# Patient Record
Sex: Male | Born: 1940 | Race: White | Hispanic: No | Smoking: Former smoker
Health system: Southern US, Community
[De-identification: ages and names within clinical notes are randomized; demographics above are authoritative.]

## PROBLEM LIST (undated history)

## (undated) DIAGNOSIS — N138 Other obstructive and reflux uropathy: Secondary | ICD-10-CM

## (undated) DIAGNOSIS — N401 Enlarged prostate with lower urinary tract symptoms: Principal | ICD-10-CM

## (undated) DIAGNOSIS — R350 Frequency of micturition: Secondary | ICD-10-CM

## (undated) DIAGNOSIS — I4891 Unspecified atrial fibrillation: Secondary | ICD-10-CM

## (undated) DIAGNOSIS — I422 Other hypertrophic cardiomyopathy: Secondary | ICD-10-CM

## (undated) DIAGNOSIS — L309 Dermatitis, unspecified: Secondary | ICD-10-CM

## (undated) DIAGNOSIS — L209 Atopic dermatitis, unspecified: Secondary | ICD-10-CM

## (undated) HISTORY — DX: Frequency of micturition: R35.0

## (undated) HISTORY — DX: Unspecified atrial fibrillation: I48.91

## (undated) HISTORY — DX: Other hypertrophic cardiomyopathy: I42.2

## (undated) HISTORY — DX: Atopic dermatitis, unspecified: L20.9

## (undated) HISTORY — DX: Dermatitis, unspecified: L30.9

## (undated) HISTORY — DX: Other obstructive and reflux uropathy: N40.1

## (undated) HISTORY — PX: TOTAL KNEE ARTHROPLASTY: SHX125

## (undated) HISTORY — DX: Other obstructive and reflux uropathy: N13.8

---

## 2008-03-17 ENCOUNTER — Observation Stay: Payer: Self-pay | Admitting: Internal Medicine

## 2011-06-28 ENCOUNTER — Ambulatory Visit: Payer: Self-pay | Admitting: Cardiology

## 2013-08-19 ENCOUNTER — Other Ambulatory Visit: Payer: Self-pay | Admitting: Podiatry

## 2013-08-19 LAB — SYNOVIAL FLUID, CRYSTAL: Crystals, Joint Fluid: NONE SEEN

## 2015-07-21 ENCOUNTER — Encounter: Payer: Self-pay | Admitting: *Deleted

## 2015-07-21 ENCOUNTER — Ambulatory Visit (INDEPENDENT_AMBULATORY_CARE_PROVIDER_SITE_OTHER): Payer: Medicare Other | Admitting: Urology

## 2015-07-21 VITALS — BP 142/83 | HR 57 | Ht 68.0 in | Wt 200.0 lb

## 2015-07-21 DIAGNOSIS — Z87440 Personal history of urinary (tract) infections: Secondary | ICD-10-CM | POA: Diagnosis not present

## 2015-07-21 DIAGNOSIS — N401 Enlarged prostate with lower urinary tract symptoms: Secondary | ICD-10-CM | POA: Diagnosis not present

## 2015-07-21 DIAGNOSIS — N138 Other obstructive and reflux uropathy: Secondary | ICD-10-CM | POA: Insufficient documentation

## 2015-07-21 LAB — URINALYSIS, COMPLETE
Bilirubin, UA: NEGATIVE
GLUCOSE, UA: NEGATIVE
Nitrite, UA: POSITIVE — AB
SPEC GRAV UA: 1.02 (ref 1.005–1.030)
Urobilinogen, Ur: 0.2 mg/dL (ref 0.2–1.0)
pH, UA: 6 (ref 5.0–7.5)

## 2015-07-21 LAB — MICROSCOPIC EXAMINATION

## 2015-07-21 NOTE — Progress Notes (Signed)
07/21/2015 9:42 AM   Andrew Anthony August 29, 1940 [id number]  Referring provider: No referring provider defined for this encounter.  Chief Complaint  Patient presents with  . Benign Prostatic Hypertrophy    1 year follow up    HPI: Patient is a 76 year old Caucasian male who presents today for an UTI and BPH with LUTS one year follow up.    UTI Patient was treated for an asymptomatic UTI by his PCP in January.   He was given Septra DS for seven days.    BPH WITH LUTS His IPSS score today is 12, which is moderate lower urinary tract symptomatology. He is mostly satisfied with his quality life due to his urinary symptoms.  His major complaint today urinary urgency.  He has had these symptoms for several  years.  He denies any dysuria, hematuria or suprapubic pain.   He also denies any recent fevers, chills, nausea or vomiting.  He has a family history of PCa, with his brother being diagnosed with prostate cancer.        IPSS      07/21/15 0900       International Prostate Symptom Score   How often have you had the sensation of not emptying your bladder? Less than 1 in 5     How often have you had to urinate less than every two hours? About half the time     How often have you found you stopped and started again several times when you urinated? Less than 1 in 5 times     How often have you found it difficult to postpone urination? About half the time     How often have you had a weak urinary stream? Less than half the time     How often have you had to strain to start urination? Not at All     How many times did you typically get up at night to urinate? 2 Times     Total IPSS Score 12     Quality of Life due to urinary symptoms   If you were to spend the rest of your life with your urinary condition just the way it is now how would you feel about that? Mostly Satisfied        Score:  1-7 Mild 8-19 Moderate 20-35 Severe       PMH: Past Medical History  Diagnosis Date   . Urinary frequency   . Hypertrophic cardiomyopathy (HCC)   . Atrial fibrillation (HCC)   . BPH with obstruction/lower urinary tract symptoms   . Atopic dermatitis   . Eczema     Surgical History: Past Surgical History  Procedure Laterality Date  . Total knee arthroplasty Right     Home Medications:    Medication List       This list is accurate as of: 07/21/15  9:42 AM.  Always use your most recent med list.               amiodarone 200 MG tablet  Commonly known as:  PACERONE  Take 100 mg by mouth.     cetirizine 10 MG tablet  Commonly known as:  ZYRTEC  Take 10 mg by mouth.     clobetasol cream 0.05 %  Commonly known as:  TEMOVATE     sulfamethoxazole-trimethoprim 800-160 MG tablet  Commonly known as:  BACTRIM DS,SEPTRA DS  Take 1 tablet by mouth 2 (two) times daily. Reported on 07/21/2015  terbinafine 1 % cream  Commonly known as:  LAMISIL  Apply topically. Reported on 07/21/2015     triamcinolone cream 0.1 %  Commonly known as:  KENALOG  Reported on 07/21/2015     XARELTO 20 MG Tabs tablet  Generic drug:  rivaroxaban  Take 20 mg by mouth.        Allergies:  Allergies  Allergen Reactions  . Azithromycin   . Clarithromycin Other (See Comments)    Dizzy, hot flashes  . Clindamycin/Lincomycin   . Penicillins   . Trimox [Amoxicillin]     Family History: Family History  Problem Relation Age of Onset  . Parkinson's disease Brother   . Prostate cancer Brother   . Kidney disease Neg Hx   . Heart failure Father     Social History:  reports that he has quit smoking. His smoking use included Cigars. He does not have any smokeless tobacco history on file. He reports that he does not drink alcohol or use illicit drugs.  ROS: UROLOGY Frequent Urination?: Yes Hard to postpone urination?: No Burning/pain with urination?: No Get up at night to urinate?: No Leakage of urine?: No Urine stream starts and stops?: No Trouble starting stream?: No Do  you have to strain to urinate?: No Blood in urine?: No Urinary tract infection?: Yes Sexually transmitted disease?: No Injury to kidneys or bladder?: No Painful intercourse?: No Weak stream?: No Erection problems?: No Penile pain?: No  Gastrointestinal Nausea?: No Vomiting?: No Indigestion/heartburn?: No Diarrhea?: No Constipation?: No  Constitutional Fever: No Night sweats?: No Weight loss?: No Fatigue?: No  Skin Skin rash/lesions?: No Itching?: Yes  Eyes Blurred vision?: No Double vision?: No  Ears/Nose/Throat Sore throat?: No Sinus problems?: No  Hematologic/Lymphatic Swollen glands?: No Easy bruising?: Yes  Cardiovascular Leg swelling?: No Chest pain?: No  Respiratory Cough?: No Shortness of breath?: No  Endocrine Excessive thirst?: No  Musculoskeletal Back pain?: No Joint pain?: Yes  Neurological Headaches?: No Dizziness?: No  Psychologic Depression?: No Anxiety?: No  Physical Exam: BP 142/83 mmHg  Pulse 57  Ht  (1.727 m)  Wt 200 lb (90.719 kg)  BMI 30.42 kg/m2  Constitutional: Well nourished. Alert and oriented, No acute distress. HEENT: [place] AT, moist mucus membranes. Trachea midline, no masses. Cardiovascular: No clubbing, cyanosis, or edema. Respiratory: Normal respiratory effort, no increased work of breathing. GI: Abdomen is soft, non tender, non distended, no abdominal masses. Liver and spleen not palpable.  No hernias appreciated.  Stool sample for occult testing is not indicated.   GU: No CVA tenderness.  No bladder fullness or masses.  Patient with circumcised phallus.   Urethral meatus is patent.  No penile discharge. No penile lesions or rashes. Scrotum without lesions, cysts, rashes and/or edema.  Testicles are located scrotally bilaterally. No masses are appreciated in the testicles. Left and right epididymis are normal. Rectal: Patient with  normal sphincter tone. Anus and perineum without scarring or rashes. No rectal  masses are appreciated. Prostate is approximately 55 grams, irregular, no nodules are appreciated. Seminal vesicles are normal. Skin: No rashes, bruises or suspicious lesions. Lymph: No cervical or inguinal adenopathy. Neurologic: Grossly intact, no focal deficits, moving all 4 extremities. Psychiatric: Normal mood and affect.  Laboratory Data: PSA History  1.18 ng/mL on 05/14/2013  1.09 ng/mL on 05/26/2015  Urinalysis Results for orders placed or performed in visit on 07/21/15  Microscopic Examination  Result Value Ref Range   WBC, UA >30 (H) 0 -  5 /hpf  RBC, UA 0-2 0 -  2 /hpf   Epithelial Cells (non renal) 0-10 0 - 10 /hpf   Mucus, UA Present (A) Not Estab.   Bacteria, UA Moderate (A) None seen/Few  Urinalysis, Complete  Result Value Ref Range   Specific Gravity, UA 1.020 1.005 - 1.030   pH, UA 6.0 5.0 - 7.5   Color, UA Yellow Yellow   Appearance Ur Cloudy (A) Clear   Leukocytes, UA 3+ (A) Negative   Protein, UA Trace (A) Negative/Trace   Glucose, UA Negative Negative   Ketones, UA Trace (A) Negative   RBC, UA Trace (A) Negative   Bilirubin, UA Negative Negative   Urobilinogen, Ur 0.2 0.2 - 1.0 mg/dL   Nitrite, UA Positive (A) Negative   Microscopic Examination See below:      Assessment & Plan:    1. UTI:   Patient was treated for an asymptomatic UTI in January.  Patient seems to perseverate on his urine.  He states he collects it in jars to look at the color and consistency of his urine.  I explained to the patient that due to the increased resistance of organisms to antibiotics and risk factors associated with taking antibiotics, such as C. Difficile, we are conservative concerning treatment of asymptomatic UTI's.  We would like to reserve antibiotic use for urinary tract infections that caused fevers, dysuria, gross hematuria, abdominal pain or mental status changes.  I have advised the patient not to have his UA performed, she was having the above listed  symptoms.  2. BPH (benign prostatic hyperplasia) with LUTS:    IPSS score is 12/2.  Patient did recently have his PSA drawn, but he would like it repeated.  He stated he doesn't remember the PSA being over 1 in the past.  I did not have access to his previous PSA at the time of his visit, but the PSA was 1.18 in 2015.  We will continue to monitor.  He will have his IPSS score, exam and PSA in 12 months.  - PSA - Urinalysis, Complete   Return in about 1 year (around 07/20/2016) for IPSS score and exam.  These notes generated with voice recognition software. I apologize for typographical errors.  Michiel CowboySHANNON Karee Christopherson, PA-C  [facility] [address], [address] [place], [place] [address] [phone]

## 2015-07-22 ENCOUNTER — Telehealth: Payer: Self-pay

## 2015-07-22 LAB — PSA: Prostate Specific Ag, Serum: 1.3 ng/mL (ref 0.0–4.0)

## 2015-07-22 NOTE — Telephone Encounter (Signed)
LMOM

## 2015-07-22 NOTE — Telephone Encounter (Signed)
-----   Message from Harle BattiestShannon A McGowan, PA-C sent at 07/22/2015  8:24 AM EDT ----- PSA is stable.  He would like to be told the actual value.  We will see him next year.

## 2015-07-23 NOTE — Telephone Encounter (Signed)
Spoke with pt in reference PSA results. Pt voiced understanding.  

## 2016-07-18 ENCOUNTER — Other Ambulatory Visit: Payer: Self-pay

## 2016-07-18 DIAGNOSIS — N401 Enlarged prostate with lower urinary tract symptoms: Secondary | ICD-10-CM

## 2016-07-19 ENCOUNTER — Other Ambulatory Visit: Payer: Medicare Other

## 2016-07-19 DIAGNOSIS — N401 Enlarged prostate with lower urinary tract symptoms: Secondary | ICD-10-CM

## 2016-07-20 LAB — PSA: PROSTATE SPECIFIC AG, SERUM: 2.1 ng/mL (ref 0.0–4.0)

## 2016-07-25 NOTE — Progress Notes (Signed)
07/26/2016 10:05 AM   Maxine Glenn [birth year] [id number]  Referring provider: Rafael Bihari, MD [phone] [facility] [address] [facility] [place], [place] [address]  Chief Complaint  Patient presents with  . Benign Prostatic Hypertrophy    1 year follow up     HPI: Patient is a 76 year old Caucasian male with BPH with LUTS who presents for a one year follow up.    BPH WITH LUTS His IPSS score today is 15, which is moderate lower urinary tract symptomatology. He is mixed with his quality life due to his urinary symptoms.  His PVR was 96 mL.  His previous I PSS score was 12/3.  His major complaint today is a weak urinary stream.  He has had these symptoms for several  years.  He denies any dysuria, hematuria or suprapubic pain.   He also denies any recent fevers, chills, nausea or vomiting.  He has a family history of PCa, with his brother being diagnosed with prostate cancer.        IPSS    Row Name 07/26/16 0900         International Prostate Symptom Score   How often have you had the sensation of not emptying your bladder? Less than half the time     How often have you had to urinate less than every two hours? Less than half the time     How often have you found you stopped and started again several times when you urinated? Less than half the time     How often have you found it difficult to postpone urination? Less than half the time     How often have you had a weak urinary stream? About half the time     How often have you had to strain to start urination? Less than half the time     How many times did you typically get up at night to urinate? 2 Times     Total IPSS Score 15       Quality of Life due to urinary symptoms   If you were to spend the rest of your life with your urinary condition just the way it is now how would you feel about that? Mixed        Score:  1-7 Mild 8-19 Moderate 20-35 Severe   PMH: Past Medical History:  Diagnosis Date  . Atopic  dermatitis   . Atrial fibrillation (HCC)   . BPH with obstruction/lower urinary tract symptoms   . Eczema   . Hypertrophic cardiomyopathy (HCC)   . Urinary frequency     Surgical History: Past Surgical History:  Procedure Laterality Date  . TOTAL KNEE ARTHROPLASTY Right     Home Medications:  Allergies as of 07/26/2016      Reactions   Azithromycin    Clarithromycin Other (See Comments)   Dizzy, hot flashes   Clindamycin/lincomycin    Lincomycin Hcl    Penicillins    Trimox [amoxicillin]       Medication List       Accurate as of 07/26/16 10:05 AM. Always use your most recent med list.          amiodarone 200 MG tablet Commonly known as:  PACERONE Take 100 mg by mouth.   cetirizine 10 MG tablet Commonly known as:  ZYRTEC Take 10 mg by mouth.   clobetasol cream 0.05 % Commonly known as:  TEMOVATE Apply 1 application topically 2 (two)  times daily.   mometasone 0.1 % lotion Commonly known as:  ELOCON   sulfamethoxazole-trimethoprim 800-160 MG tablet Commonly known as:  BACTRIM DS,SEPTRA DS Take 1 tablet by mouth 2 (two) times daily. Reported on 07/21/2015   terbinafine 1 % cream Commonly known as:  LAMISIL Apply topically. Reported on 07/21/2015   triamcinolone cream 0.1 % Commonly known as:  KENALOG Reported on 07/21/2015   XARELTO 20 MG Tabs tablet Generic drug:  rivaroxaban Take 20 mg by mouth.       Allergies:  Allergies  Allergen Reactions  . Azithromycin   . Clarithromycin Other (See Comments)    Dizzy, hot flashes  . Clindamycin/Lincomycin   . Lincomycin Hcl   . Penicillins   . Trimox [Amoxicillin]     Family History: Family History  Problem Relation Age of Onset  . Parkinson's disease Brother   . Prostate cancer Brother   . Heart failure Father   . Kidney disease Neg Hx   . Kidney cancer Neg Hx   . Bladder Cancer Neg Hx     Social History:  reports that he has quit smoking. His smoking use included Cigars. He has quit using  smokeless tobacco. His smokeless tobacco use included Chew. He reports that he does not drink alcohol or use drugs.  ROS: UROLOGY Frequent Urination?: No Hard to postpone urination?: No Burning/pain with urination?: No Get up at night to urinate?: No Leakage of urine?: No Urine stream starts and stops?: No Trouble starting stream?: No Do you have to strain to urinate?: No Blood in urine?: No Urinary tract infection?: No Sexually transmitted disease?: No Injury to kidneys or bladder?: No Painful intercourse?: No Weak stream?: No Erection problems?: No Penile pain?: No  Gastrointestinal Nausea?: No Vomiting?: No Indigestion/heartburn?: No Diarrhea?: No Constipation?: No  Constitutional Fever: No Night sweats?: No Weight loss?: No Fatigue?: No  Skin Skin rash/lesions?: Yes Itching?: Yes  Eyes Blurred vision?: No Double vision?: No  Ears/Nose/Throat Sore throat?: No Sinus problems?: No  Hematologic/Lymphatic Swollen glands?: No Easy bruising?: No  Cardiovascular Leg swelling?: No Chest pain?: No  Respiratory Cough?: No Shortness of breath?: No  Endocrine Excessive thirst?: No  Musculoskeletal Back pain?: No Joint pain?: No  Neurological Headaches?: No Dizziness?: No  Psychologic Depression?: No Anxiety?: No  Physical Exam: BP 120/85   Pulse 83   Ht 5\' 9"  (1.753 m)   Wt 189 lb 6.4 oz (85.9 kg)   BMI 27.97 kg/m   Constitutional: Well nourished. Alert and oriented, No acute distress. HEENT: [place] AT, moist mucus membranes. Trachea midline, no masses. Cardiovascular: No clubbing, cyanosis, or edema. Respiratory: Normal respiratory effort, no increased work of breathing. GI: Abdomen is soft, non tender, non distended, no abdominal masses. Liver and spleen not palpable.  No hernias appreciated.  Stool sample for occult testing is not indicated.   GU: No CVA tenderness.  No bladder fullness or masses.  Patient with circumcised phallus.    Urethral meatus is patent.  No penile discharge. No penile lesions or rashes. Scrotum without lesions, cysts, rashes and/or edema.  Testicles are located scrotally bilaterally. No masses are appreciated in the testicles. Left and right epididymis are normal. Rectal: Patient with  normal sphincter tone. Anus and perineum without scarring or rashes. No rectal masses are appreciated. Prostate is approximately 55 grams, irregular, no nodules are appreciated. Seminal vesicles are normal. Skin: No rashes, bruises or suspicious lesions. Lymph: No cervical or inguinal adenopathy. Neurologic: Grossly intact, no focal deficits, moving  all 4 extremities. Psychiatric: Normal mood and affect.  Laboratory Data: PSA History  1.18 ng/mL on 05/14/2013  1.09 ng/mL on 05/26/2015  1.50 ng/mL on 01/11/2016 - free screening  3.67 ng/mL on 05/26/2016 - PCP's office  2.10 ng/mL on 07/19/2016  Pertinent Imaging Results for orders placed or performed in visit on 07/26/16  BLADDER SCAN AMB NON-IMAGING  Result Value Ref Range   Scan Result 96      Assessment & Plan:    1. BPH with LUTS  - IPSS score is 15/3, it is worsening  - Continue conservative management, avoiding bladder irritants and timed voiding's  - most bothersome symptoms is/are a weak stream  - discussed starting a medication at this time, finasteride, but patient deferred at this time  - RTC in 6 months for IPSS, PSA, PVR and exam   2. UTI  - Patient was treated for an asymptomatic UTI in January.  Patient seems to perseverate on his urine.  He states he collects it in jars to look at the color and consistency of his urine.  I explained to the patient that due to the increased resistance of organisms to antibiotics and risk factors associated with taking antibiotics, such as C. Difficile, we are conservative concerning treatment of asymptomatic UTI's.  We would like to reserve antibiotic use for urinary tract infections that caused fevers,  dysuria, gross hematuria, abdominal pain or mental status changes.  I have advised the patient not to have his UA performed, she was having the above listed symptoms.  3. PSA Screening  - I discussed the AUA Guideline's (2013) for men aged 70+ years or any man with less than a 10 to 15 year life expectancy that screening is not recommended.  If the individual is in excellent health and after discussion it is decided to do a screening PSA, the threshold for biopsy should be raised to 10 ng/mL and if the PSA returns below 3 ng/mL, discontinue screening - patient is very concerned about his PSA levels as he attends free screenings and has his PCP check his PSA.  I explained to the patient that at his age if prostate cancer is diagnosed the treatment would be aimed at palliative vs curative - he would like to have his PSA every six months  Return in about 6 months (around 01/26/2017) for IPSS, PSA and exam.  These notes generated with voice recognition software. I apologize for typographical errors.  Michiel Cowboy, PA-C  [facility] [address], [address] [place], [place] [address] [phone]

## 2016-07-26 ENCOUNTER — Ambulatory Visit (INDEPENDENT_AMBULATORY_CARE_PROVIDER_SITE_OTHER): Payer: Medicare Other | Admitting: Urology

## 2016-07-26 ENCOUNTER — Encounter: Payer: Self-pay | Admitting: Urology

## 2016-07-26 VITALS — BP 120/85 | HR 83 | Ht 69.0 in | Wt 189.4 lb

## 2016-07-26 DIAGNOSIS — N401 Enlarged prostate with lower urinary tract symptoms: Secondary | ICD-10-CM

## 2016-07-26 DIAGNOSIS — N3000 Acute cystitis without hematuria: Secondary | ICD-10-CM

## 2016-07-26 DIAGNOSIS — N138 Other obstructive and reflux uropathy: Secondary | ICD-10-CM | POA: Diagnosis not present

## 2016-07-26 DIAGNOSIS — Z125 Encounter for screening for malignant neoplasm of prostate: Secondary | ICD-10-CM

## 2016-07-26 LAB — BLADDER SCAN AMB NON-IMAGING: Scan Result: 96

## 2016-09-15 ENCOUNTER — Emergency Department
Admission: EM | Admit: 2016-09-15 | Discharge: 2016-09-15 | Disposition: A | Discharge status: Home / Self Care | Payer: Medicare Other | Attending: Emergency Medicine | Admitting: Emergency Medicine

## 2016-09-15 ENCOUNTER — Encounter: Payer: Self-pay | Admitting: Emergency Medicine

## 2016-09-15 DIAGNOSIS — T50901A Poisoning by unspecified drugs, medicaments and biological substances, accidental (unintentional), initial encounter: Secondary | ICD-10-CM

## 2016-09-15 DIAGNOSIS — T45511A Poisoning by anticoagulants, accidental (unintentional), initial encounter: Secondary | ICD-10-CM | POA: Insufficient documentation

## 2016-09-15 DIAGNOSIS — Z87891 Personal history of nicotine dependence: Secondary | ICD-10-CM | POA: Insufficient documentation

## 2016-09-15 NOTE — Discharge Instructions (Signed)
Please take your Amiodarone today as normal. Take your xarelto tomorrow afternoon as we discussed.

## 2016-09-15 NOTE — ED Notes (Signed)
Says he mistakenly took his xarelto instead of the amiodarone this am.  He normally takes his xarelto in the evening.  No sx.  Has not taken his amiodarone yet.  In nad.

## 2016-09-15 NOTE — ED Provider Notes (Signed)
[facility] Emergency Department Provider Note   ____________________________________________    I have reviewed the triage vital signs and the nursing notes.   HISTORY  Chief Complaint Medication question     HPI Andrew Anthony is a 76 y.o. male who presents with an accidental drug overdose. Patient reports he takes Xarelto for atrial fibrillation. Today he accidentally took a Xarelto instead of an amiodarone. Typically he takes Xarelto at night and amiodarone in the morning, he did take a Xarelto last night. He tried to make himself throw up but was unsuccessful. He feels well now and has no complaints. No bleeding, normal stools.   Past Medical History:  Diagnosis Date  . Atopic dermatitis   . Atrial fibrillation (HCC)   . BPH with obstruction/lower urinary tract symptoms   . Eczema   . Hypertrophic cardiomyopathy (HCC)   . Urinary frequency     Patient Active Problem List   Diagnosis Date Noted  . BPH with obstruction/lower urinary tract symptoms 07/21/2015    Past Surgical History:  Procedure Laterality Date  . TOTAL KNEE ARTHROPLASTY Right     Prior to Admission medications   Medication Sig Start Date End Date Taking? Authorizing Provider  amiodarone (PACERONE) 200 MG tablet Take 100 mg by mouth. 01/30/14  Yes [provider]  cetirizine (ZYRTEC) 10 MG tablet Take 10 mg by mouth.   Yes [provider]  clobetasol cream (TEMOVATE) 0.05 % Apply 1 application topically 2 (two) times daily.   Yes [provider]  mometasone (ELOCON) 0.1 % lotion  01/21/15   [provider]  rivaroxaban (XARELTO) 20 MG TABS tablet Take 20 mg by mouth. 07/29/14   [provider]  sulfamethoxazole-trimethoprim (BACTRIM DS,SEPTRA DS) 800-160 MG tablet Take 1 tablet by mouth 2 (two) times daily. Reported on 07/21/2015 06/01/15   [provider]  terbinafine (LAMISIL) 1 % cream Apply topically. Reported on  07/21/2015    [provider]  triamcinolone cream (KENALOG) 0.1 % Reported on 07/21/2015 02/12/14   [provider]     Allergies Azithromycin; Clarithromycin; Clindamycin/lincomycin; Lincomycin hcl; Penicillins; and Trimox [amoxicillin]  Family History  Problem Relation Age of Onset  . Parkinson's disease Brother   . Prostate cancer Brother   . Heart failure Father   . Kidney disease Neg Hx   . Kidney cancer Neg Hx   . Bladder Cancer Neg Hx     Social History Social History  Substance Use Topics  . Smoking status: Former Smoker    Types: Cigars  . Smokeless tobacco: Former Neurosurgeon    Types: Chew     Comment: quit 30 years  . Alcohol use No    Review of Systems  Constitutional: No Dizziness Eyes: No visual changes.   Cardiovascular: Denies chest pain. Respiratory: Denies shortness of breath. Gastrointestinal: No abdominal pain.  No nausea, no vomiting.  No dark stools  Genitourinary: Negative for hematuria  Skin: Negative for rash. Neurological: Negative for headaches or weakness   ____________________________________________   PHYSICAL EXAM:  VITAL SIGNS: ED Triage Vitals [09/15/16 0743]  Enc Vitals Group     BP (!) 125/93     Pulse Rate (!) 57     Resp 20     Temp 98.6 F (37 C)     Temp Source Oral     SpO2 100 %     Weight 185 lb (83.9 kg)     Height 5\' 9"  (1.753 m)  Head Circumference      Peak Flow      Pain Score 0     Pain Loc      Pain Edu?      Excl. in GC?     Constitutional: Alert and oriented. No acute distress. Pleasant and interactive Eyes: Conjunctivae are normal.   Nose: No Epistaxis Mouth/Throat: Mucous membranes are moist.  No bleeding from gums  Cardiovascular: Mild bradycardia, regular rhythm.  Good peripheral circulation. Respiratory: Normal respiratory effort.  No retractions. Gastrointestinal: Soft and nontender. No distention.  No CVA tenderness.  Musculoskeletal Warm and well perfused Neurologic:   Normal speech and language. No gross focal neurologic deficits are appreciated.  Skin:  Skin is warm, dry and intact. No petechiae Psychiatric: Mood and affect are normal. Speech and behavior are normal.  ____________________________________________   LABS (all labs ordered are listed, but only abnormal results are displayed)  Labs Reviewed - No data to display ____________________________________________  EKG  None ____________________________________________  RADIOLOGY  None ____________________________________________   PROCEDURES  Procedure(s) performed: No    Critical Care performed: No ____________________________________________   INITIAL IMPRESSION / ASSESSMENT AND PLAN / ED COURSE  Pertinent labs & imaging results that were available during my care of the patient were reviewed by me and considered in my medical decision making (see chart for details).  Patient well-appearing and in no acute distress. He is asymptomatic. Took one Xarelto instead of an amiodarone as above. Recommended he hold his Xarelto dose this evening and re-continue tomorrow. Recommend taking amiodarone when he gets home today. Return precautions and bleeding precautions given accidental overdose    ____________________________________________   FINAL CLINICAL IMPRESSION(S) / ED DIAGNOSES  Final diagnoses:  Accidental drug overdose, initial encounter      NEW MEDICATIONS STARTED DURING THIS VISIT:  New Prescriptions   No medications on file     Note:  This document was prepared using Dragon voice recognition software and may include unintentional dictation errors.    Jene EveryKinner, [place], MD 09/15/16 [phone]

## 2016-09-15 NOTE — ED Triage Notes (Signed)
Pt reports took his Xarelto last night and accidentally took it again this morning instead of his amiodarone. Pt has not taken amiodarone this morning yet.

## 2017-01-18 ENCOUNTER — Other Ambulatory Visit: Payer: Self-pay

## 2017-01-18 DIAGNOSIS — N401 Enlarged prostate with lower urinary tract symptoms: Secondary | ICD-10-CM

## 2017-01-19 ENCOUNTER — Other Ambulatory Visit: Payer: Medicare Other

## 2017-01-19 DIAGNOSIS — N401 Enlarged prostate with lower urinary tract symptoms: Secondary | ICD-10-CM

## 2017-01-20 LAB — PSA: Prostate Specific Ag, Serum: 0.8 ng/mL (ref 0.0–4.0)

## 2017-01-26 ENCOUNTER — Ambulatory Visit (INDEPENDENT_AMBULATORY_CARE_PROVIDER_SITE_OTHER): Payer: Medicare Other | Admitting: Urology

## 2017-01-26 ENCOUNTER — Encounter: Payer: Self-pay | Admitting: Urology

## 2017-01-26 VITALS — BP 126/81 | HR 65 | Ht 69.0 in | Wt 195.7 lb

## 2017-01-26 DIAGNOSIS — Z125 Encounter for screening for malignant neoplasm of prostate: Secondary | ICD-10-CM | POA: Diagnosis not present

## 2017-01-26 DIAGNOSIS — N138 Other obstructive and reflux uropathy: Secondary | ICD-10-CM

## 2017-01-26 DIAGNOSIS — N401 Enlarged prostate with lower urinary tract symptoms: Secondary | ICD-10-CM | POA: Diagnosis not present

## 2017-01-26 NOTE — Progress Notes (Signed)
01/26/2017 9:39 AM   Maxine Glenn [birth year] [id number]  Referring provider: Rafael Bihari, MD [phone] [facility] [address] [facility] [place], [place] [address]  Chief Complaint  Patient presents with  . Benign Prostatic Hypertrophy    6 month follow up    HPI: Patient is a 76 year old Caucasian male with BPH with LUTS who presents for a one year follow up.    BPH WITH LUTS His IPSS score today is 8, which is moderate lower urinary tract symptomatology.  He is mostly satisfied with his quality life due to his urinary symptoms.  His previous I PSS score was 15/3.  His previous PVR was 96 mL.  His major complaints today are urgency and nocturia.  He has had these symptoms for few years.  He denies any dysuria, hematuria or suprapubic pain. He also denies any recent fevers, chills, nausea or vomiting.  He has a family history of PCa, with his brother being diagnosed with prostate cancer.   He is taking saw palmetto and lycopene supplements.       IPSS    Row Name 01/26/17 0900         International Prostate Symptom Score   How often have you had the sensation of not emptying your bladder? Less than 1 in 5     How often have you had to urinate less than every two hours? Less than 1 in 5 times     How often have you found you stopped and started again several times when you urinated? Less than 1 in 5 times     How often have you found it difficult to postpone urination? Less than 1 in 5 times     How often have you had a weak urinary stream? Less than half the time     How often have you had to strain to start urination? Not at All     How many times did you typically get up at night to urinate? 2 Times     Total IPSS Score 8       Quality of Life due to urinary symptoms   If you were to spend the rest of your life with your urinary condition just the way it is now how would you feel about that? Mostly Satisfied        Score:  1-7 Mild 8-19 Moderate 20-35  Severe   PMH: Past Medical History:  Diagnosis Date  . Atopic dermatitis   . Atrial fibrillation (HCC)   . BPH with obstruction/lower urinary tract symptoms   . Eczema   . Hypertrophic cardiomyopathy (HCC)   . Urinary frequency     Surgical History: Past Surgical History:  Procedure Laterality Date  . TOTAL KNEE ARTHROPLASTY Right     Home Medications:  Allergies as of 01/26/2017      Reactions   Azithromycin    Clarithromycin Other (See Comments)   Dizzy, hot flashes   Clindamycin/lincomycin    Lincomycin Hcl    Penicillins    Trimox [amoxicillin]       Medication List       Accurate as of 01/26/17  9:39 AM. Always use your most recent med list.          amiodarone 200 MG tablet Commonly known as:  PACERONE Take 100 mg by mouth.   cetirizine 10 MG tablet Commonly known as:  ZYRTEC Take 10 mg by mouth.   clobetasol cream 0.05 %  Commonly known as:  TEMOVATE Apply 1 application topically 2 (two) times daily.   mometasone 0.1 % lotion Commonly known as:  ELOCON   sulfamethoxazole-trimethoprim 800-160 MG tablet Commonly known as:  BACTRIM DS,SEPTRA DS Take 1 tablet by mouth 2 (two) times daily. Reported on 07/21/2015   tacrolimus 0.1 % ointment Commonly known as:  PROTOPIC Apply topically.   terbinafine 1 % cream Commonly known as:  LAMISIL Apply topically. Reported on 07/21/2015   triamcinolone cream 0.1 % Commonly known as:  KENALOG Reported on 07/21/2015   XARELTO 20 MG Tabs tablet Generic drug:  rivaroxaban Take 20 mg by mouth.       Allergies:  Allergies  Allergen Reactions  . Azithromycin   . Clarithromycin Other (See Comments)    Dizzy, hot flashes  . Clindamycin/Lincomycin   . Lincomycin Hcl   . Penicillins   . Trimox [Amoxicillin]     Family History: Family History  Problem Relation Age of Onset  . Parkinson's disease Brother   . Prostate cancer Brother   . Heart failure Father   . Kidney disease Neg Hx   . Kidney cancer  Neg Hx   . Bladder Cancer Neg Hx     Social History:  reports that he has quit smoking. His smoking use included Cigars. He has quit using smokeless tobacco. His smokeless tobacco use included Chew. He reports that he does not drink alcohol or use drugs.  ROS: UROLOGY Frequent Urination?: No Hard to postpone urination?: Yes Burning/pain with urination?: No Get up at night to urinate?: Yes Leakage of urine?: No Urine stream starts and stops?: No Trouble starting stream?: No Do you have to strain to urinate?: No Blood in urine?: No Urinary tract infection?: No Sexually transmitted disease?: No Injury to kidneys or bladder?: No Painful intercourse?: No Weak stream?: No Erection problems?: No Penile pain?: No  Gastrointestinal Nausea?: No Vomiting?: No Indigestion/heartburn?: No Diarrhea?: No Constipation?: No  Constitutional Fever: No Night sweats?: No Weight loss?: No Fatigue?: No  Skin Skin rash/lesions?: Yes Itching?: Yes  Eyes Blurred vision?: No Double vision?: No  Ears/Nose/Throat Sore throat?: No Sinus problems?: No  Hematologic/Lymphatic Swollen glands?: No Easy bruising?: No  Cardiovascular Leg swelling?: No Chest pain?: No  Respiratory Cough?: No Shortness of breath?: No  Endocrine Excessive thirst?: No  Musculoskeletal Back pain?: No Joint pain?: No  Neurological Headaches?: No Dizziness?: No  Psychologic Depression?: No Anxiety?: No  Physical Exam: BP 126/81   Pulse 65   Ht  (1.753 m)   Wt 195 lb 11.2 oz (88.8 kg)   BMI 28.90 kg/m   Constitutional: Well nourished. Alert and oriented, No acute distress. HEENT: [place] AT, moist mucus membranes. Trachea midline, no masses. Cardiovascular: No clubbing, cyanosis, or edema. Respiratory: Normal respiratory effort, no increased work of breathing. GI: Abdomen is soft, non tender, non distended, no abdominal masses. Liver and spleen not palpable.  No hernias appreciated.  Stool  sample for occult testing is not indicated.   GU: No CVA tenderness.  No bladder fullness or masses.  Patient with circumcised phallus.   Urethral meatus is patent.  No penile discharge. No penile lesions or rashes. Scrotum without lesions, cysts, rashes and/or edema.  Testicles are located scrotally bilaterally. No masses are appreciated in the testicles. Left and right epididymis are normal. Rectal: Patient with  normal sphincter tone. Anus and perineum without scarring or rashes. No rectal masses are appreciated. Prostate is approximately 55 grams, irregular, no nodules are appreciated.  Seminal vesicles are normal. Skin: No rashes, bruises or suspicious lesions. Lymph: No cervical or inguinal adenopathy. Neurologic: Grossly intact, no focal deficits, moving all 4 extremities. Psychiatric: Normal mood and affect.  Laboratory Data: PSA History  1.18 ng/mL on 05/14/2013  1.09 ng/mL on 05/26/2015  1.50 ng/mL on 01/11/2016 - free screening  3.67 ng/mL on 05/26/2016 - PCP's office  2.10 ng/mL on 07/19/2016  Pertinent Imaging Results for orders placed or performed in visit on 01/19/17  PSA  Result Value Ref Range   Prostate Specific Ag, Serum 0.8 0.0 - 4.0 ng/mL     Assessment & Plan:    1. BPH with LUTS  - IPSS score is 8/2, it is improving   - Continue conservative management, avoiding bladder irritants and timed voiding's  - most bothersome symptoms is/are a urgency and nocturia  - discussed starting a medication at this time, finasteride, but patient deferred at this time  - RTC in 6 months for IPSS, PSA, PVR and exam   2. PSA Screening  - he would still like his PSA checked every 12 months  Return in about 1 year (around 01/26/2018) for IPSS, PSA and exam.  These notes generated with voice recognition software. I apologize for typographical errors.  Michiel Cowboy, PA-C  [facility] [address] [place], [place] [address] [phone]

## 2017-08-30 DEATH — deceased

## 2018-01-23 ENCOUNTER — Ambulatory Visit: Payer: Medicare Other | Admitting: Urology

## 2018-01-25 ENCOUNTER — Other Ambulatory Visit: Payer: Self-pay

## 2018-01-30 ENCOUNTER — Ambulatory Visit: Payer: Medicare Other | Admitting: Urology
